# Patient Record
Sex: Female | Born: 1985 | Race: White | Hispanic: No | Smoking: Current every day smoker
Health system: Southern US, Community
[De-identification: ages and names within clinical notes are randomized; demographics above are authoritative.]

## PROBLEM LIST (undated history)

## (undated) DIAGNOSIS — J45909 Unspecified asthma, uncomplicated: Secondary | ICD-10-CM

## (undated) DIAGNOSIS — F419 Anxiety disorder, unspecified: Secondary | ICD-10-CM

## (undated) DIAGNOSIS — F319 Bipolar disorder, unspecified: Secondary | ICD-10-CM

## (undated) DIAGNOSIS — R454 Irritability and anger: Secondary | ICD-10-CM

## (undated) DIAGNOSIS — M1990 Unspecified osteoarthritis, unspecified site: Secondary | ICD-10-CM

## (undated) DIAGNOSIS — N926 Irregular menstruation, unspecified: Secondary | ICD-10-CM

## (undated) DIAGNOSIS — M5020 Other cervical disc displacement, unspecified cervical region: Secondary | ICD-10-CM

## (undated) DIAGNOSIS — F603 Borderline personality disorder: Secondary | ICD-10-CM

## (undated) DIAGNOSIS — I456 Pre-excitation syndrome: Secondary | ICD-10-CM

## (undated) HISTORY — PX: TUBAL LIGATION: SHX77

## (undated) HISTORY — DX: Irritability and anger: R45.4

## (undated) HISTORY — DX: Borderline personality disorder: F60.3

## (undated) HISTORY — DX: Unspecified osteoarthritis, unspecified site: M19.90

## (undated) HISTORY — DX: Irregular menstruation, unspecified: N92.6

## (undated) HISTORY — DX: Unspecified asthma, uncomplicated: J45.909

## (undated) HISTORY — DX: Other cervical disc displacement, unspecified cervical region: M50.20

## (undated) HISTORY — DX: Bipolar disorder, unspecified: F31.9

## (undated) HISTORY — DX: Anxiety disorder, unspecified: F41.9

---

## 2014-10-05 ENCOUNTER — Emergency Department (HOSPITAL_COMMUNITY)
Admission: EM | Admit: 2014-10-05 | Discharge: 2014-10-05 | Disposition: A | Discharge status: Home / Self Care | Payer: Medicaid Other | Attending: Emergency Medicine | Admitting: Emergency Medicine

## 2014-10-05 ENCOUNTER — Encounter (HOSPITAL_COMMUNITY): Payer: Self-pay | Admitting: Emergency Medicine

## 2014-10-05 DIAGNOSIS — Z8679 Personal history of other diseases of the circulatory system: Secondary | ICD-10-CM | POA: Insufficient documentation

## 2014-10-05 DIAGNOSIS — Z720 Tobacco use: Secondary | ICD-10-CM | POA: Diagnosis not present

## 2014-10-05 DIAGNOSIS — M542 Cervicalgia: Secondary | ICD-10-CM | POA: Diagnosis present

## 2014-10-05 DIAGNOSIS — M5412 Radiculopathy, cervical region: Secondary | ICD-10-CM | POA: Diagnosis not present

## 2014-10-05 HISTORY — DX: Pre-excitation syndrome: I45.6

## 2014-10-05 MED ORDER — IBUPROFEN 200 MG PO TABS: 200.0000 mg | ORAL_TABLET | Freq: Three times a day (TID) | ORAL | Status: DC

## 2014-10-05 MED ORDER — HYDROCODONE-ACETAMINOPHEN 5-325 MG PO TABS: 1.0000 | ORAL_TABLET | Freq: Four times a day (QID) | ORAL | Status: AC | PRN

## 2014-10-05 MED ORDER — KETOROLAC TROMETHAMINE 30 MG/ML IJ SOLN
30.0000 mg | Freq: Once | INTRAMUSCULAR | Status: AC
  Administered 2014-10-05: 30 mg via INTRAMUSCULAR
  Filled 2014-10-05: qty 1

## 2014-10-05 MED ORDER — DIAZEPAM 5 MG PO TABS: 5.0000 mg | ORAL_TABLET | Freq: Two times a day (BID) | ORAL | Status: DC

## 2014-10-05 NOTE — ED Notes (Signed)
Patient upset because of medications ordered prior to DC and DC Rx Patient stated, "What the fuck is Advil going to do for me?!" Patient informed of rationale for Rx x 3 at time of DC

## 2014-10-05 NOTE — ED Notes (Signed)
Pt c/o neck pain x 2 months, states that when she moves right arm pain shouts into neck and shoulder blade causing her arm to go numb. Pt states at times she gets migraine and dizziness with the shooting pains.

## 2014-10-05 NOTE — Discharge Instructions (Signed)
As discussed, your physical exam today was largely reassuring.  Your arm and neck pain is likely due to radiculopathy and inflammation.  However, it is important that you follow-up with our orthopedist for appropriate ongoing evaluation and management of your symptoms.  Please take all medication as directed.  Please use ice packs, 4 times daily.  Please be sure to return here for concerning changes in your condition.

## 2014-10-05 NOTE — ED Notes (Signed)
Patient ambulatory from triage without difficulty--gait steady Patient alert and oriented x 4 and in NAD

## 2014-10-05 NOTE — ED Provider Notes (Signed)
CSN: [id number]     Arrival date & time 10/05/14  1222 History   First MD Initiated Contact with Patient 10/05/14 1326     Chief Complaint  Patient presents with  . Neck Pain  . Arm Pain  . Shoulder Pain     HPI  Patient presents with concern of ongoing right upper extremity pain.  She notes that the pain was present approximately 3 weeks ago, with focal soreness about the right superior lateral trapezius area radiating down the right posterior arm.  Pain improved for several days after massage, rest.  Since that time pain has become more severe, more present.  The pain is severe, sharp.  There is occasional numbness.  There is no loss of function, but then pain associated with movement. No other complaints, no chest pain, other neck pain, lightheadedness, nausea, vomiting, syncope.   Past Medical History  Diagnosis Date  . WPW (Wolff-Parkinson-White syndrome)    Past Surgical History  Procedure Laterality Date  . Cesarean section    . Tubal ligation     No family history on file. History  Substance Use Topics  . Smoking status: Current Every Day Smoker -- 1.00 packs/day    Types: Cigarettes  . Smokeless tobacco: Not on file  . Alcohol Use: No   OB History    No data available     Review of Systems  All other systems reviewed and are negative.     Allergies  Percocet  Home Medications   Prior to Admission medications   Medication Sig Start Date End Date Taking? Authorizing Provider  diazepam (VALIUM) 5 MG tablet Take 1 tablet (5 mg total) by mouth 2 (two) times daily. 10/05/14   Gerhard Munchobert Lemoyne Scarpati, MD  HYDROcodone-acetaminophen (NORCO/VICODIN) 5-325 MG per tablet Take 1 tablet by mouth every 6 (six) hours as needed for severe pain. 10/05/14   Gerhard Munchobert Othello Dickenson, MD  ibuprofen (ADVIL,MOTRIN) 200 MG tablet Take 1 tablet (200 mg total) by mouth 3 (three) times daily. 10/05/14   Gerhard Munchobert Erynne Kealey, MD   BP 137/79 mmHg  Pulse 94  Temp(Src) 98 F (36.7 C) (Oral)  Resp 22   SpO2 99%  LMP 09/22/2014 Physical Exam  Constitutional: She is oriented to person, place, and time. She appears well-developed and well-nourished. No distress.  HENT:  Head: Normocephalic and atraumatic.  Eyes: Conjunctivae and EOM are normal.  Neck: Full passive range of motion without pain. Neck supple. Muscular tenderness present.    Cardiovascular: Normal rate and regular rhythm.   Pulmonary/Chest: Effort normal and breath sounds normal. No stridor. No respiratory distress.  Abdominal: She exhibits no distension.  Musculoskeletal: She exhibits no edema.  Right hand and wrist are unremarkable.  The patient has appropriate range of motion and strength in the right elbow, no pain referred to the shoulder with elbow motion.  No gross deformities throughout the extremity. Patient is hesitant to abduct, flex, extend the right shoulder secondary to pain.  Neurological: She is alert and oriented to person, place, and time. No cranial nerve deficit.  Sensation and strength intact in the right upper extremity, and throughout  Skin: Skin is warm and dry.  Psychiatric: She has a normal mood and affect.  Nursing note and vitals reviewed.   ED Course  Procedures (including critical care time) Imaging not indicated  MDM   Final diagnoses:  Cervical radiculopathy    Patient presents with atraumatic pain in the right upper extremity.  Patient has distal integrity of the neurovascular status,  has no history of trauma, no evidence for septic arthritis, no neurologic dysfunction.  Symptoms maybe secondary to retinopathy versus other soft tissue injury.  Patient started on a course of anti-inflammatories, muscle relaxants, discharged in stable condition.    Gerhard Munchobert Oberia Beaudoin, MD 10/05/14 1438

## 2014-11-11 ENCOUNTER — Ambulatory Visit: Payer: Medicaid Other | Attending: Physical Medicine and Rehabilitation

## 2014-11-11 DIAGNOSIS — M5412 Radiculopathy, cervical region: Secondary | ICD-10-CM | POA: Diagnosis not present

## 2014-11-11 DIAGNOSIS — M542 Cervicalgia: Secondary | ICD-10-CM | POA: Diagnosis present

## 2014-11-11 DIAGNOSIS — M5010 Cervical disc disorder with radiculopathy, unspecified cervical region: Secondary | ICD-10-CM

## 2014-11-11 NOTE — Therapy (Addendum)
[place], [place], [address] Phone: [phone]   Fax:  [phone]  Physical Therapy Evaluation  Patient Details  Name: Kari King MRN: [id number] Date of Birth: [birth year]  Encounter Date: 11/11/2014      PT End of Session - 11/11/14 1528    Visit Number 1   Number of Visits 1   PT Start Time 1430   PT Stop Time 1525   PT Time Calculation (min) 55 min   Activity Tolerance Patient tolerated treatment well   Behavior During Therapy [facility] for tasks assessed/performed      Past Medical History  Diagnosis Date  . WPW (Wolff-Parkinson-White syndrome)     Past Surgical History  Procedure Laterality Date  . Cesarean section    . Tubal ligation      There were no vitals taken for this visit.  Visit Diagnosis:  Cervicalgia - Plan: PT plan of care cert/re-cert  Cervical radiculitis - Plan: PT plan of care cert/re-cert  Cervical disc disorder with radiculopathy of cervical region - Plan: PT plan of care cert/re-cert      Subjective Assessment - 11/11/14 1441    Symptoms Today her pain is from base of neck to between scapula. Pain comes and goes. Pain worse in AM.    Pertinent History In October of this year she began to have neck painn like a kink and this is progressively worsened.    Limitations Lifting  Lying   How long can you sit comfortably? Always uncomfortable with max sit 30 min   How long can you stand comfortably? 30 min with home tasks   How long can you walk comfortably? As needed   Diagnostic tests MRI : C 5-6 disc extrusion.    Patient Stated Goals Decreased pain.    Currently in Pain? Yes   Pain Score 4   highest in past week 9/10   Pain Location Neck   Pain Orientation Right  Lt less than RT   Pain Descriptors / Indicators Aching;Burning  stiff   Pain Type --  Sub acute   Pain Onset More than a month ago   Pain Frequency Constant   Aggravating Factors  Activity   Pain Relieving Factors Rest , lying , meds   Effect of Pain on Daily Activities Limited as she needs nmore rest breaks   Multiple Pain Sites No          [facility] PT Assessment - 11/11/14 1449    Assessment   Medical Diagnosis cervical radiculitis C6   Onset Date --  08/2014   Next MD Visit Next month   Prior Therapy No   Precautions   Precautions None   Restrictions   Weight Bearing Restrictions No   Balance Screen   Has the patient fallen in the past 6 months No   Has the patient had a decrease in activity level because of a fear of falling?  No   Is the patient reluctant to leave their home because of a fear of falling?  No   Prior Function   Level of Independence Independent with basic ADLs   Observation/Other Assessments   Focus on Therapeutic Outcomes (FOTO)  None taken as one visit ony   Posture/Postural Control   Posture/Postural Control --  Mild forewrd head   AROM   Cervical Flexion 30   Cervical Extension 50   Cervical - Right Side Bend 40   Cervical - Left Side Bend 35  Cervical - Right Rotation 63   Cervical - Left Rotation 63   Strength   Overall Strength Within functional limits for tasks performed   Special Tests    Special Tests Cervical   Cervical Tests Dictraction;Spurling's   Spurling's   Findings Positive   Side Right   Comment Pain Rt and LT on side of compression but no radicular symptoms   Distraction Test   Findngs Negative                          PT Education - 11/11/14 1527    Education provided Yes   Education Details HEP   Person(s) Educated Patient   Methods Explanation;Demonstration;Verbal cues;Tactile cues   Comprehension Verbalized understanding;Returned demonstration             PT Long Term Goals - 11/11/14 1530    PT LONG TERM GOAL #1   Title Independent with HEP issued at eval   Status Achieved               Plan - 11/11/14 1529    Clinical Impression Statement She is doing well with  normal strength but pain levels can be high at times and is basically constant   Rehab Potential Good   PT Frequency One time visit   PT Treatment/Interventions Patient/family education;Therapeutic exercise   Recommended Other Services Suggested and given vendor purchase of home traction unit   Consulted and Agree with Plan of Care Patient         Problem List There are no active problems to display for this patient.   Darrel Hoover PT 11/11/2014, 3:34 PM  [facility] [address] [place], [place], [address] Phone: [phone]   Fax:  [phone]  PHYSICAL THERAPY DISCHARGE SUMMARY  Visits from Start of Care: 1  Current functional level related to goals / functional outcomes: Same as at eval   Remaining deficits: Same as at eval as she did not want to come to PT self pay   Education / Equipment: HEP  Plan: Patient agrees to discharge.  Patient goals were met. Patient is being discharged due to financial reasons.  ?????

## 2014-11-11 NOTE — Patient Instructions (Signed)
HEP issued (with red and green T band) for stabilization/posture and flexibility. She agreed to HEP and declined further PT due to cost. She will do HEP 2-3 x/day.

## 2015-06-27 ENCOUNTER — Encounter (HOSPITAL_COMMUNITY): Payer: Self-pay | Admitting: *Deleted

## 2015-06-27 ENCOUNTER — Emergency Department (HOSPITAL_COMMUNITY): Payer: Medicaid Other

## 2015-06-27 ENCOUNTER — Emergency Department (HOSPITAL_COMMUNITY)
Admission: EM | Admit: 2015-06-27 | Discharge: 2015-06-27 | Disposition: A | Discharge status: Home / Self Care | Payer: Medicaid Other | Attending: Emergency Medicine | Admitting: Emergency Medicine

## 2015-06-27 DIAGNOSIS — Z8679 Personal history of other diseases of the circulatory system: Secondary | ICD-10-CM | POA: Insufficient documentation

## 2015-06-27 DIAGNOSIS — Y9389 Activity, other specified: Secondary | ICD-10-CM | POA: Diagnosis not present

## 2015-06-27 DIAGNOSIS — W500XXA Accidental hit or strike by another person, initial encounter: Secondary | ICD-10-CM | POA: Diagnosis not present

## 2015-06-27 DIAGNOSIS — Y998 Other external cause status: Secondary | ICD-10-CM | POA: Diagnosis not present

## 2015-06-27 DIAGNOSIS — Z720 Tobacco use: Secondary | ICD-10-CM | POA: Diagnosis not present

## 2015-06-27 DIAGNOSIS — Z79899 Other long term (current) drug therapy: Secondary | ICD-10-CM | POA: Diagnosis not present

## 2015-06-27 DIAGNOSIS — Y9289 Other specified places as the place of occurrence of the external cause: Secondary | ICD-10-CM | POA: Diagnosis not present

## 2015-06-27 DIAGNOSIS — Z791 Long term (current) use of non-steroidal anti-inflammatories (NSAID): Secondary | ICD-10-CM | POA: Diagnosis not present

## 2015-06-27 DIAGNOSIS — S6991XA Unspecified injury of right wrist, hand and finger(s), initial encounter: Secondary | ICD-10-CM | POA: Insufficient documentation

## 2015-06-27 MED ORDER — IBUPROFEN 200 MG PO TABS
600.0000 mg | ORAL_TABLET | Freq: Once | ORAL | Status: AC
  Administered 2015-06-27: 600 mg via ORAL
  Filled 2015-06-27: qty 3

## 2015-06-27 MED ORDER — IBUPROFEN 600 MG PO TABS: 600.0000 mg | ORAL_TABLET | Freq: Once | ORAL | Status: AC

## 2015-06-27 NOTE — Discharge Instructions (Signed)
Your x-ray is normal.  You have  been placed in a special splint.  Called a thumb spica to help immobilize the joint until it feels better in several days.  You've also been given a prescription for ibuprofen to take on a regular basis as well as a referral to the hand surgeon if your thumb is not feeling better in 7-10 days
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
## 2015-07-31 ENCOUNTER — Encounter: Payer: Self-pay | Admitting: *Deleted

## 2015-08-05 ENCOUNTER — Ambulatory Visit: Payer: Medicaid Other | Admitting: Internal Medicine

## 2015-08-18 ENCOUNTER — Encounter: Payer: Self-pay | Admitting: *Deleted

## 2015-08-20 ENCOUNTER — Ambulatory Visit: Payer: Medicaid Other | Admitting: Internal Medicine

## 2015-08-28 ENCOUNTER — Encounter (HOSPITAL_COMMUNITY): Payer: Self-pay | Admitting: *Deleted

## 2015-08-28 ENCOUNTER — Emergency Department (HOSPITAL_COMMUNITY)
Admission: EM | Admit: 2015-08-28 | Discharge: 2015-08-28 | Disposition: A | Discharge status: Home / Self Care | Payer: Medicaid Other | Attending: Emergency Medicine | Admitting: Emergency Medicine

## 2015-08-28 ENCOUNTER — Emergency Department (HOSPITAL_COMMUNITY): Payer: Medicaid Other

## 2015-08-28 DIAGNOSIS — M1990 Unspecified osteoarthritis, unspecified site: Secondary | ICD-10-CM | POA: Insufficient documentation

## 2015-08-28 DIAGNOSIS — Z8679 Personal history of other diseases of the circulatory system: Secondary | ICD-10-CM | POA: Insufficient documentation

## 2015-08-28 DIAGNOSIS — M79671 Pain in right foot: Secondary | ICD-10-CM | POA: Diagnosis present

## 2015-08-28 DIAGNOSIS — J45909 Unspecified asthma, uncomplicated: Secondary | ICD-10-CM | POA: Insufficient documentation

## 2015-08-28 DIAGNOSIS — Z8742 Personal history of other diseases of the female genital tract: Secondary | ICD-10-CM | POA: Insufficient documentation

## 2015-08-28 DIAGNOSIS — Z79899 Other long term (current) drug therapy: Secondary | ICD-10-CM | POA: Insufficient documentation

## 2015-08-28 DIAGNOSIS — F603 Borderline personality disorder: Secondary | ICD-10-CM | POA: Insufficient documentation

## 2015-08-28 DIAGNOSIS — F419 Anxiety disorder, unspecified: Secondary | ICD-10-CM | POA: Insufficient documentation

## 2015-08-28 DIAGNOSIS — Z720 Tobacco use: Secondary | ICD-10-CM | POA: Insufficient documentation

## 2015-08-28 MED ORDER — NAPROXEN 500 MG PO TABS: 500.0000 mg | ORAL_TABLET | Freq: Two times a day (BID) | ORAL | Status: AC

## 2015-08-28 NOTE — ED Notes (Addendum)
Pt complains of pain in her right midfoot to her two small toes for the past month. Pt states the pain is worse in the morning. Pt states it is painful to flex her toes. Pt denies injury to her foot.
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
## 2015-08-28 NOTE — Discharge Instructions (Signed)
Heat Therapy  Heat therapy can help ease sore, stiff, injured, and tight muscles and joints. Heat relaxes your muscles, which may help ease your pain.   RISKS AND COMPLICATIONS  If you have any of the following conditions, do not use heat therapy unless your health care provider has approved:   Poor circulation.   Healing wounds or scarred skin in the area being treated.   Diabetes, heart disease, or high blood pressure.   Not being able to feel (numbness) the area being treated.   Unusual swelling of the area being treated.   Active infections.   Blood clots.   Cancer.   Inability to communicate pain. This may include young children and people who have problems with their brain function (dementia).   Pregnancy.  Heat therapy should only be used on old, pre-existing, or long-lasting (chronic) injuries. Do not use heat therapy on new injuries unless directed by your health care provider.  HOW TO USE HEAT THERAPY  There are several different kinds of heat therapy, including:   Moist heat pack.   Warm water bath.   Hot water bottle.   Electric heating pad.   Heated gel pack.   Heated wrap.   Electric heating pad.  Use the heat therapy method suggested by your health care provider. Follow your health care provider's instructions on when and how to use heat therapy.  GENERAL HEAT THERAPY RECOMMENDATIONS   Do not sleep while using heat therapy. Only use heat therapy while you are awake.   Your skin may turn pink while using heat therapy. Do not use heat therapy if your skin turns red.   Do not use heat therapy if you have new pain.   High heat or long exposure to heat can cause burns. Be careful when using heat therapy to avoid burning your skin.   Do not use heat therapy on areas of your skin that are already irritated, such as with a rash or sunburn.  SEEK MEDICAL CARE IF:   You have blisters, redness, swelling, or numbness.   You have new pain.   Your pain is worse.  MAKE SURE  YOU:   Understand these instructions.   Will watch your condition.   Will get help right away if you are not doing well or get worse.     This information is not intended to replace advice given to you by your health care provider. Make sure you discuss any questions you have with your health care provider.     Document Released: 01/23/2012 Document Revised: 11/21/2014 Document Reviewed: 12/24/2013  Elsevier Interactive Patient Education 2016 [employer]

## 2015-09-02 ENCOUNTER — Encounter: Payer: Self-pay | Admitting: Internal Medicine

## 2017-10-05 IMAGING — CR DG FOOT COMPLETE 3+V*R*
3 series · 3 of 3 positions shown · non-contrast
Comparison: None.

CLINICAL DATA: Right mid foot pain last month

EXAM:
RIGHT FOOT COMPLETE - 3+ VIEW

[x foot ap right]
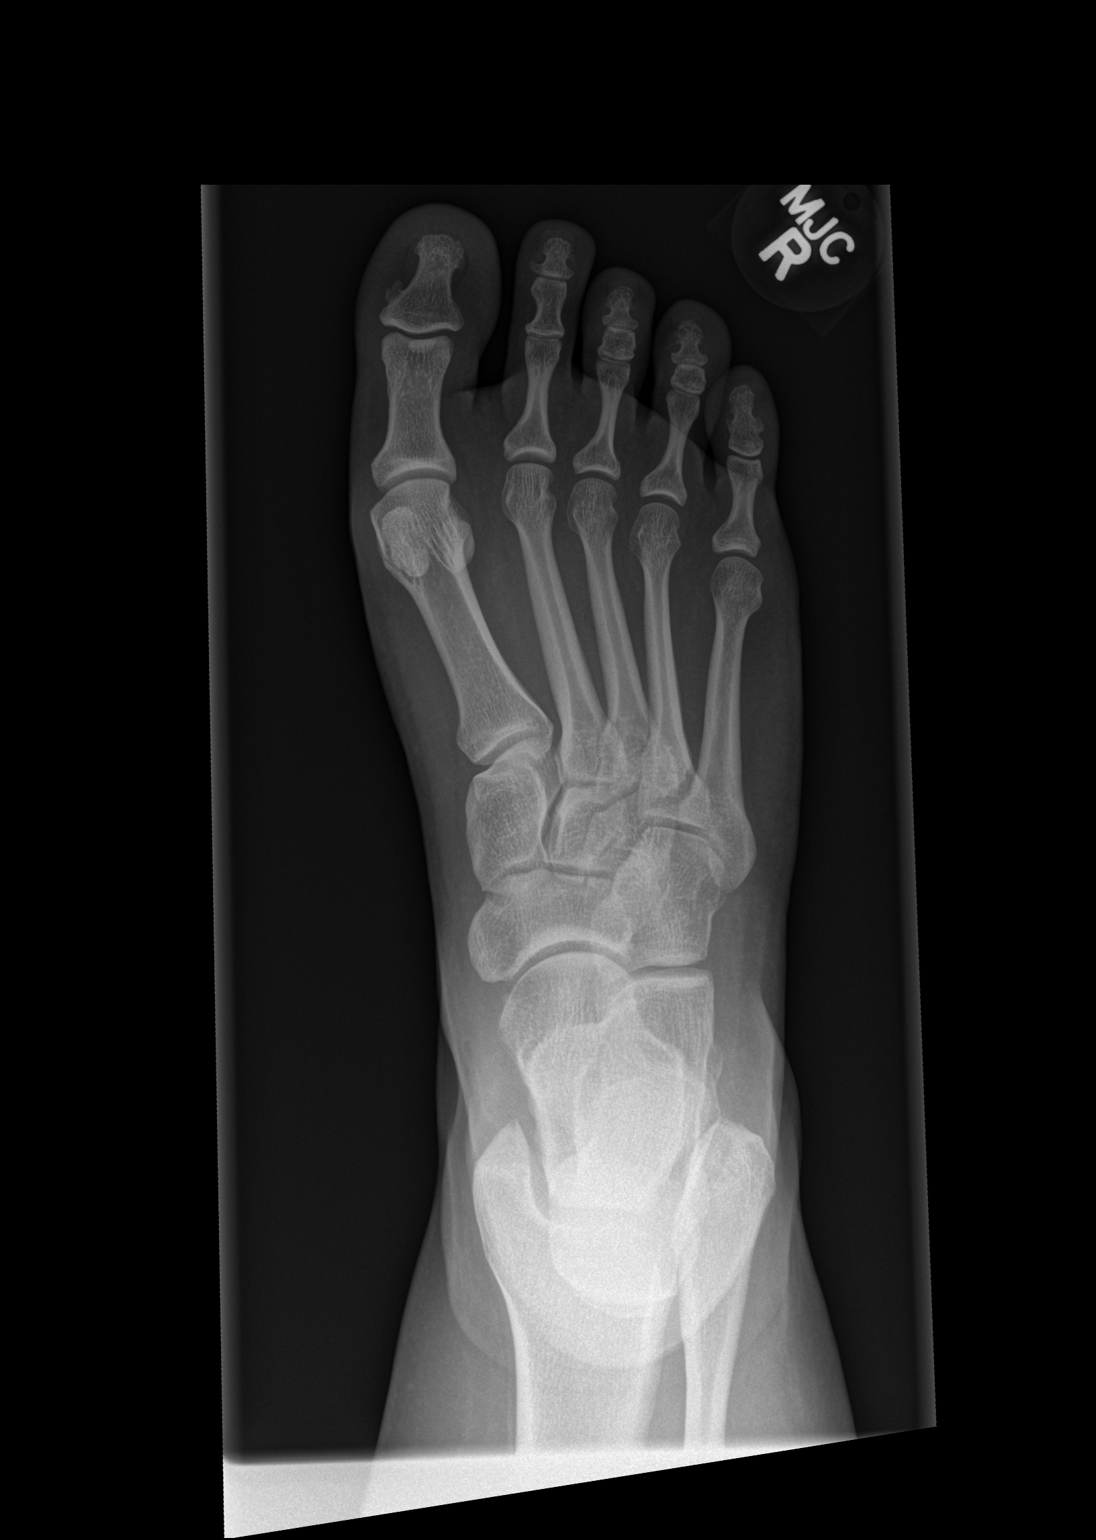

[x foot obl right]
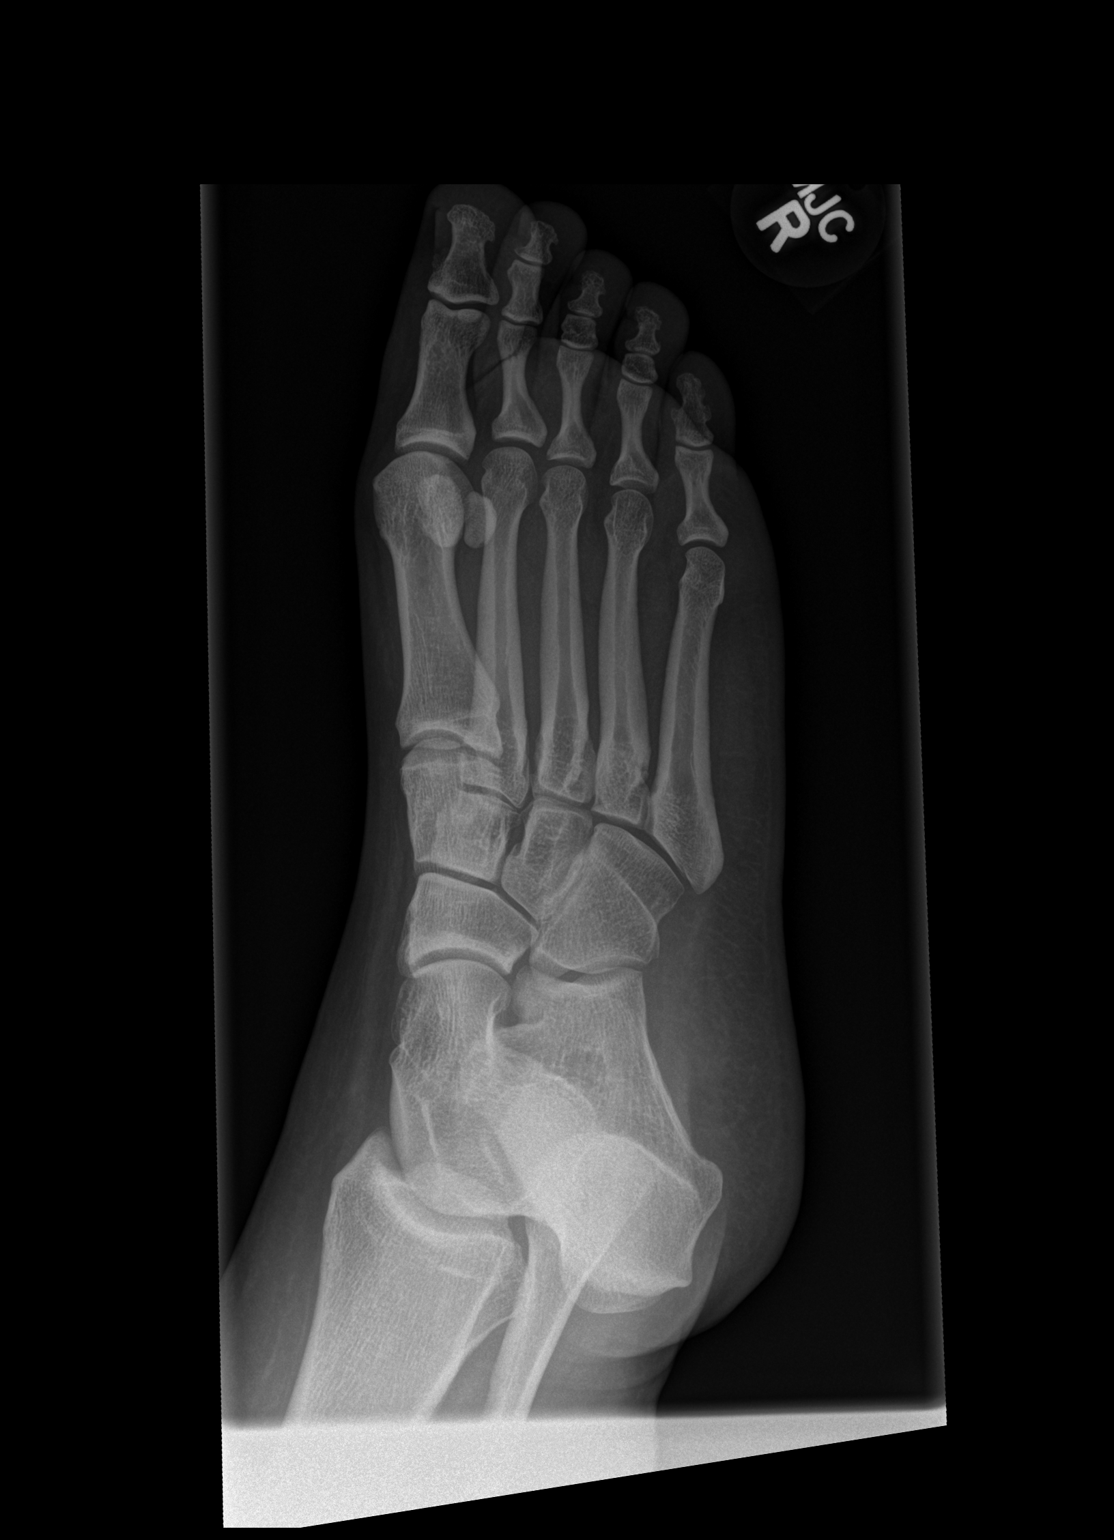

[x foot lat right]
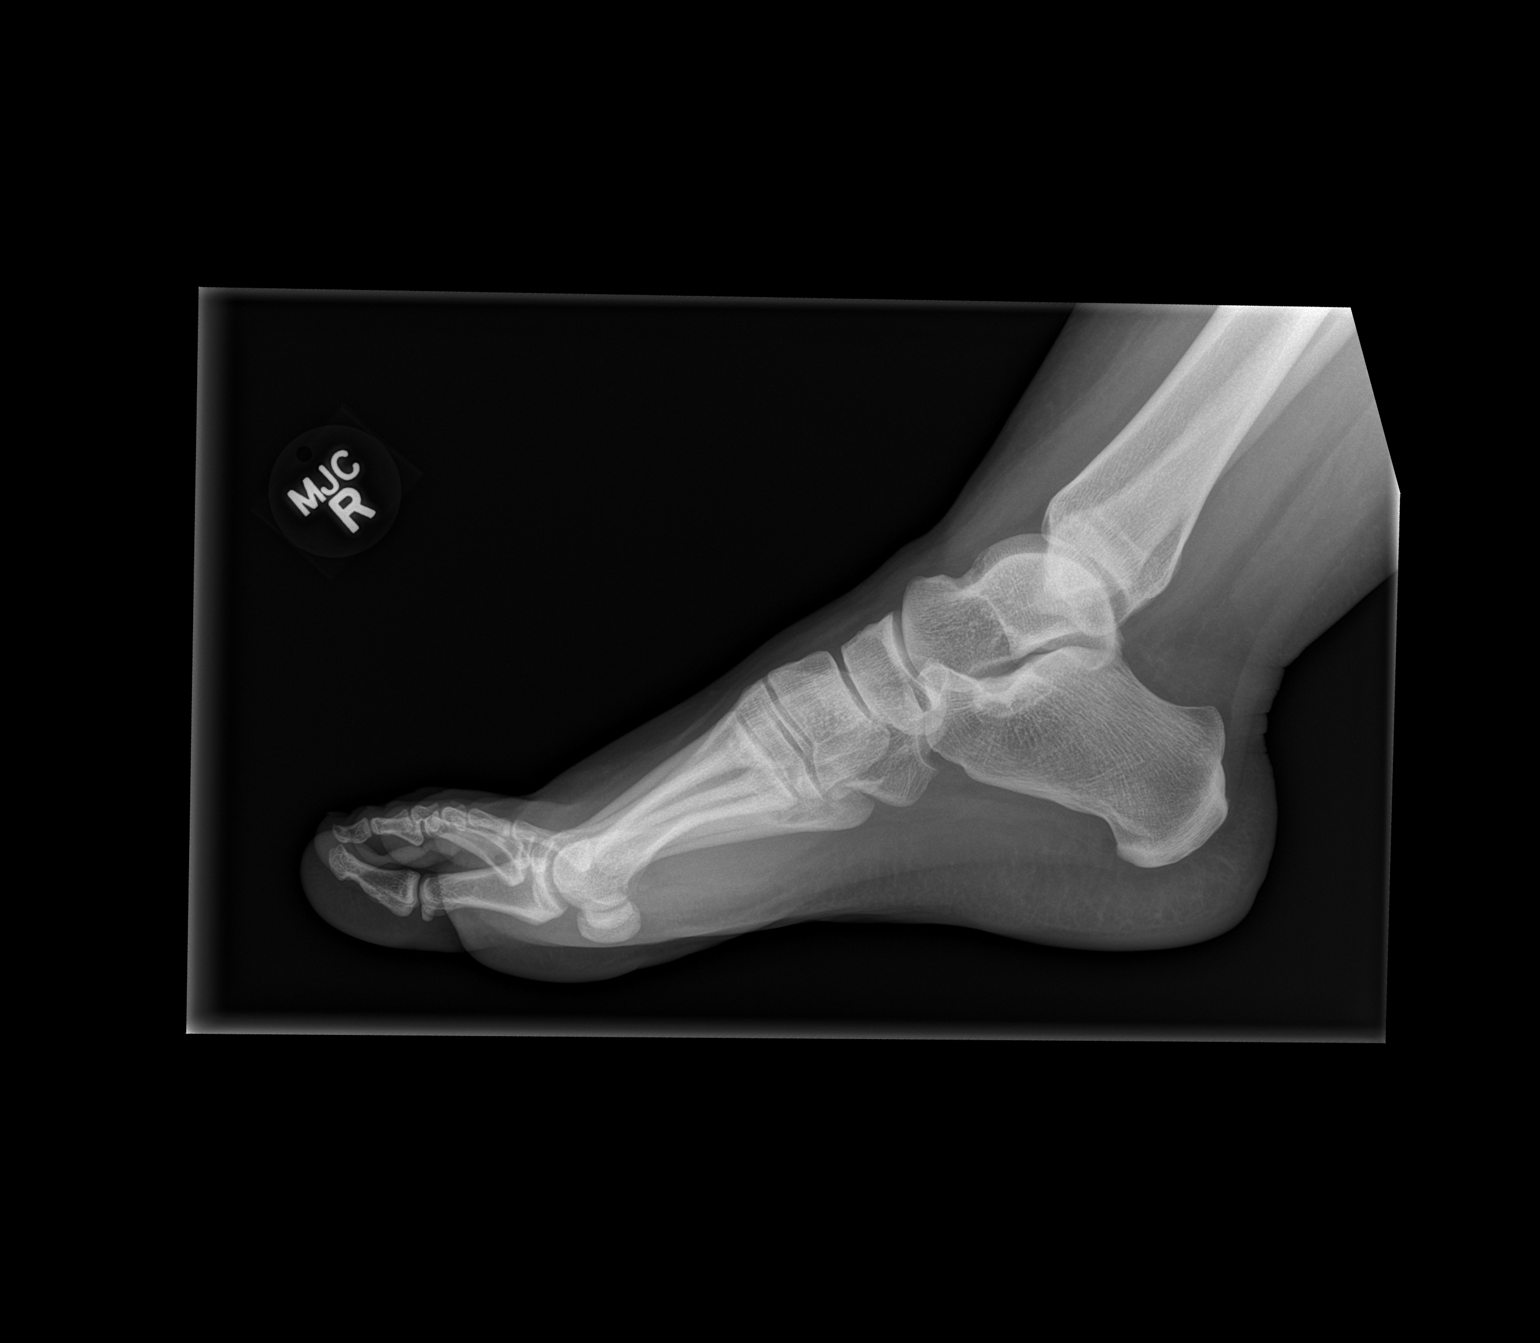

[3 of 3 positions shown; findings below may reference images not displayed]

FINDINGS: Three views of the right foot submitted. No acute fracture or
subluxation. No radiopaque foreign body.
IMPRESSION: Negative.
